# Patient Record
Sex: Female | Born: 1955 | Race: White | Hispanic: No | Marital: Married | Smoking: Former smoker
Health system: Southern US, Community
[De-identification: ages and names within clinical notes are randomized; demographics above are authoritative.]

## PROBLEM LIST (undated history)

## (undated) DIAGNOSIS — M1990 Unspecified osteoarthritis, unspecified site: Secondary | ICD-10-CM

## (undated) HISTORY — PX: FOOT SURGERY: SHX648

---

## 2004-01-08 ENCOUNTER — Ambulatory Visit: Payer: Self-pay | Admitting: Rheumatology

## 2005-04-23 ENCOUNTER — Ambulatory Visit: Payer: Self-pay | Admitting: Rheumatology

## 2005-08-18 ENCOUNTER — Ambulatory Visit: Payer: Self-pay | Admitting: Family Medicine

## 2005-09-14 ENCOUNTER — Ambulatory Visit (HOSPITAL_BASED_OUTPATIENT_CLINIC_OR_DEPARTMENT_OTHER): Admission: RE | Admit: 2005-09-14 | Discharge: 2005-09-15 | Payer: Self-pay | Admitting: Orthopedic Surgery

## 2005-11-12 ENCOUNTER — Encounter: Admission: RE | Admit: 2005-11-12 | Discharge: 2005-11-12 | Payer: Self-pay | Admitting: Orthopedic Surgery

## 2006-02-07 ENCOUNTER — Ambulatory Visit: Payer: Self-pay | Admitting: Obstetrics and Gynecology

## 2007-03-08 ENCOUNTER — Ambulatory Visit: Payer: Self-pay | Admitting: Obstetrics and Gynecology

## 2008-03-11 ENCOUNTER — Ambulatory Visit: Payer: Self-pay | Admitting: Obstetrics and Gynecology

## 2010-07-05 ENCOUNTER — Ambulatory Visit: Payer: Self-pay | Admitting: Unknown Physician Specialty

## 2011-11-15 ENCOUNTER — Ambulatory Visit: Payer: Self-pay | Admitting: Obstetrics and Gynecology

## 2014-01-14 ENCOUNTER — Ambulatory Visit: Payer: Self-pay | Admitting: Family Medicine

## 2014-05-21 ENCOUNTER — Ambulatory Visit: Payer: Self-pay | Admitting: Unknown Physician Specialty

## 2014-05-25 LAB — EXPECTORATED SPUTUM ASSESSMENT W GRAM STAIN, RFLX TO RESP C

## 2016-12-01 ENCOUNTER — Other Ambulatory Visit: Payer: Self-pay | Admitting: Family Medicine

## 2016-12-01 DIAGNOSIS — Z1231 Encounter for screening mammogram for malignant neoplasm of breast: Secondary | ICD-10-CM

## 2016-12-22 ENCOUNTER — Ambulatory Visit
Admission: RE | Admit: 2016-12-22 | Discharge: 2016-12-22 | Disposition: A | Discharge status: Home / Self Care | Payer: BC Managed Care – PPO | Source: Ambulatory Visit | Attending: Family Medicine | Admitting: Family Medicine

## 2016-12-22 DIAGNOSIS — Z1231 Encounter for screening mammogram for malignant neoplasm of breast: Secondary | ICD-10-CM | POA: Diagnosis present

## 2018-01-08 ENCOUNTER — Other Ambulatory Visit: Payer: Self-pay | Admitting: Family Medicine

## 2018-01-08 DIAGNOSIS — Z1231 Encounter for screening mammogram for malignant neoplasm of breast: Secondary | ICD-10-CM

## 2018-02-06 ENCOUNTER — Ambulatory Visit
Admission: RE | Admit: 2018-02-06 | Discharge: 2018-02-06 | Disposition: A | Discharge status: Home / Self Care | Payer: BC Managed Care – PPO | Source: Ambulatory Visit | Attending: Family Medicine | Admitting: Family Medicine

## 2018-02-06 DIAGNOSIS — Z1231 Encounter for screening mammogram for malignant neoplasm of breast: Secondary | ICD-10-CM | POA: Insufficient documentation

## 2019-06-19 ENCOUNTER — Other Ambulatory Visit: Payer: Self-pay | Admitting: Student

## 2019-06-19 DIAGNOSIS — R1032 Left lower quadrant pain: Secondary | ICD-10-CM

## 2019-06-21 ENCOUNTER — Other Ambulatory Visit: Payer: Self-pay

## 2019-06-21 ENCOUNTER — Encounter (INDEPENDENT_AMBULATORY_CARE_PROVIDER_SITE_OTHER): Payer: Self-pay

## 2019-06-21 ENCOUNTER — Ambulatory Visit
Admission: RE | Admit: 2019-06-21 | Discharge: 2019-06-21 | Disposition: A | Discharge status: Home / Self Care | Payer: BC Managed Care – PPO | Source: Ambulatory Visit | Attending: Student | Admitting: Student

## 2019-06-21 DIAGNOSIS — R1032 Left lower quadrant pain: Secondary | ICD-10-CM | POA: Diagnosis present

## 2019-06-27 ENCOUNTER — Ambulatory Visit
Admission: RE | Admit: 2019-06-27 | Discharge: 2019-06-27 | Disposition: A | Discharge status: Home / Self Care | Payer: BC Managed Care – PPO | Attending: Urology | Admitting: Urology

## 2019-06-27 ENCOUNTER — Other Ambulatory Visit: Payer: Self-pay

## 2019-06-27 ENCOUNTER — Encounter: Payer: Self-pay | Admitting: Urology

## 2019-06-27 ENCOUNTER — Encounter
Admission: RE | Disposition: A | Discharge status: Home / Self Care | Payer: Self-pay | Source: Home / Self Care | Attending: Urology

## 2019-06-27 DIAGNOSIS — Z79899 Other long term (current) drug therapy: Secondary | ICD-10-CM | POA: Diagnosis not present

## 2019-06-27 DIAGNOSIS — N201 Calculus of ureter: Secondary | ICD-10-CM | POA: Diagnosis present

## 2019-06-27 DIAGNOSIS — M069 Rheumatoid arthritis, unspecified: Secondary | ICD-10-CM | POA: Diagnosis not present

## 2019-06-27 DIAGNOSIS — Z87442 Personal history of urinary calculi: Secondary | ICD-10-CM | POA: Insufficient documentation

## 2019-06-27 HISTORY — PX: EXTRACORPOREAL SHOCK WAVE LITHOTRIPSY: SHX1557

## 2019-06-27 HISTORY — DX: Unspecified osteoarthritis, unspecified site: M19.90

## 2019-06-27 SURGERY — LITHOTRIPSY, ESWL
Anesthesia: Moderate Sedation | Laterality: Left

## 2019-06-27 MED ORDER — LEVOFLOXACIN 500 MG PO TABS: 500.0000 mg | ORAL_TABLET | ORAL | Status: AC

## 2019-06-27 MED ORDER — MORPHINE SULFATE (PF) 10 MG/ML IV SOLN: 10.0000 mg | Freq: Once | INTRAVENOUS | Status: AC

## 2019-06-27 MED ORDER — DIPHENHYDRAMINE HCL 25 MG PO CAPS
ORAL_CAPSULE | ORAL | Status: AC
  Administered 2019-06-27: 25 mg via ORAL
  Filled 2019-06-27: qty 1

## 2019-06-27 MED ORDER — MIDAZOLAM HCL 2 MG/2ML IJ SOLN
INTRAMUSCULAR | Status: AC
  Administered 2019-06-27: 1 mg via INTRAMUSCULAR
  Filled 2019-06-27: qty 2

## 2019-06-27 MED ORDER — MIDAZOLAM HCL 2 MG/2ML IJ SOLN: 1.0000 mg | Freq: Once | INTRAMUSCULAR | Status: AC

## 2019-06-27 MED ORDER — DIPHENHYDRAMINE HCL 25 MG PO CAPS: 25.0000 mg | ORAL_CAPSULE | ORAL | Status: AC

## 2019-06-27 MED ORDER — FUROSEMIDE 10 MG/ML IJ SOLN: 10.0000 mg | Freq: Once | INTRAMUSCULAR | Status: DC

## 2019-06-27 MED ORDER — LEVOFLOXACIN 500 MG PO TABS
ORAL_TABLET | ORAL | Status: AC
  Administered 2019-06-27: 13:00:00 500 mg via ORAL
  Filled 2019-06-27: qty 1

## 2019-06-27 MED ORDER — MORPHINE SULFATE (PF) 10 MG/ML IV SOLN
INTRAVENOUS | Status: AC
  Administered 2019-06-27: 10 mg via INTRAMUSCULAR
  Filled 2019-06-27: qty 1

## 2019-06-27 MED ORDER — DEXTROSE-NACL 5-0.45 % IV SOLN: INTRAVENOUS | Status: DC

## 2019-06-27 MED ORDER — PROMETHAZINE HCL 25 MG/ML IJ SOLN: 25.0000 mg | Freq: Once | INTRAMUSCULAR | Status: AC

## 2019-06-27 MED ORDER — PROMETHAZINE HCL 25 MG/ML IJ SOLN
INTRAMUSCULAR | Status: AC
  Administered 2019-06-27: 25 mg via INTRAMUSCULAR
  Filled 2019-06-27: qty 1

## 2019-06-27 NOTE — Discharge Instructions (Addendum)
AMBULATORY SURGERY  DISCHARGE INSTRUCTIONS   1) The drugs that you were given will stay in your system until tomorrow so for the next 24 hours you should not:  A) Drive an automobile B) Make any legal decisions C) Drink any alcoholic beverage   2) You may resume regular meals tomorrow.  Today it is better to start with liquids and gradually work up to solid foods.  You may eat anything you prefer, but it is better to start with liquids, then soup and crackers, and gradually work up to solid foods.   3) Please notify your doctor immediately if you have any unusual bleeding, trouble breathing, redness and pain at the surgery site, drainage, fever, or pain not relieved by medication.    4) Additional Instructions: FOllow up 5/12/210 AT 0930. Follow Dr Sheral Flow dc instruction sheet        Please contact your physician with any problems or Same Day Surgery at [phone], Monday through Friday 6 am to 4 pm, or [place] [facility] number at [phone].Lithotripsy, Care After This sheet gives you information about how to care for yourself after your procedure. Your health care provider may also give you more specific instructions. If you have problems or questions, contact your health care provider. What can I expect after the procedure? After the procedure, it is common to have:  Some blood in your urine. This should only last for a few days.  Soreness in your back, sides, or upper abdomen for a few days.  Blotches or bruises on your back where the pressure wave entered the skin.  Pain, discomfort, or nausea when pieces (fragments) of the kidney stone move through the tube that carries urine from the kidney to the bladder (ureter). Stone fragments may pass soon after the procedure, but they may continue to pass for up to 4-8 weeks. ? If you have severe pain or nausea, contact your health care provider. This may be caused by a large stone that was not broken up, and this may  mean that you need more treatment.  Some pain or discomfort during urination.  Some pain or discomfort in the lower abdomen or (in men) at the base of the penis. Follow these instructions at home: Medicines  Take over-the-counter and prescription medicines only as told by your health care provider.  If you were prescribed an antibiotic medicine, take it as told by your health care provider. Do not stop taking the antibiotic even if you start to feel better.  Do not drive for 24 hours if you were given a medicine to help you relax (sedative).  Do not drive or use heavy machinery while taking prescription pain medicine. Eating and drinking      Drink enough water and fluids to keep your urine clear or pale yellow. This helps any remaining pieces of the stone to pass. It can also help prevent new stones from forming.  Eat plenty of fresh fruits and vegetables.  Follow instructions from your health care provider about eating and drinking restrictions. You may be instructed: ? To reduce how much salt (sodium) you eat or drink. Check ingredients and nutrition facts on packaged foods and beverages. ? To reduce how much meat you eat.  Eat the recommended amount of calcium for your age and gender. Ask your health care provider how much calcium you should have. General instructions  Get plenty of rest.  Most people can resume normal activities 1-2 days after the procedure. Ask your  health care provider what activities are safe for you.  Your health care provider may direct you to lie in a certain position (postural drainage) and tap firmly (percuss) over your kidney area to help stone fragments pass. Follow instructions as told by your health care provider.  If directed, strain all urine through the strainer that was provided by your health care provider. ? Keep all fragments for your health care provider to see. Any stones that are found may be sent to a medical lab for examination. The  stone may be as small as a grain of salt.  Keep all follow-up visits as told by your health care provider. This is important. Contact a health care provider if:  You have pain that is severe or does not get better with medicine.  You have nausea that is severe or does not go away.  You have blood in your urine longer than your health care provider told you to expect.  You have more blood in your urine.  You have pain during urination that does not go away.  You urinate more frequently than usual and this does not go away.  You develop a rash or any other possible signs of an allergic reaction. Get help right away if:  You have severe pain in your back, sides, or upper abdomen.  You have severe pain while urinating.  Your urine is very dark red.  You have blood in your stool (feces).  You cannot pass any urine at all.  You feel a strong urge to urinate after emptying your bladder.  You have a fever or chills.  You develop shortness of breath, difficulty breathing, or chest pain.  You have severe nausea that leads to persistent vomiting.  You faint. Summary  After this procedure, it is common to have some pain, discomfort, or nausea when pieces (fragments) of the kidney stone move through the tube that carries urine from the kidney to the bladder (ureter). If this pain or nausea is severe, however, you should contact your health care provider.  Most people can resume normal activities 1-2 days after the procedure. Ask your health care provider what activities are safe for you.  Drink enough water and fluids to keep your urine clear or pale yellow. This helps any remaining pieces of the stone to pass, and it can help prevent new stones from forming.  If directed, strain your urine and keep all fragments for your health care provider to see. Fragments or stones may be as small as a grain of salt.  Get help right away if you have severe pain in your back, sides, or upper  abdomen or have severe pain while urinating. This information is not intended to replace advice given to you by your health care provider. Make sure you discuss any questions you have with your health care provider. Document Revised: 05/28/2018 Document Reviewed: 01/06/2016 Elsevier Patient Education  2020 [employer].

## 2020-05-18 ENCOUNTER — Other Ambulatory Visit: Payer: Self-pay | Admitting: Family Medicine

## 2020-05-18 DIAGNOSIS — Z1231 Encounter for screening mammogram for malignant neoplasm of breast: Secondary | ICD-10-CM

## 2020-06-08 ENCOUNTER — Other Ambulatory Visit: Payer: Self-pay

## 2020-06-08 ENCOUNTER — Ambulatory Visit
Admission: RE | Admit: 2020-06-08 | Discharge: 2020-06-08 | Disposition: A | Discharge status: Home / Self Care | Payer: BC Managed Care – PPO | Source: Ambulatory Visit | Attending: Family Medicine | Admitting: Family Medicine

## 2020-06-08 DIAGNOSIS — Z1231 Encounter for screening mammogram for malignant neoplasm of breast: Secondary | ICD-10-CM | POA: Diagnosis present

## 2021-06-01 ENCOUNTER — Other Ambulatory Visit: Payer: Self-pay | Admitting: Physical Medicine & Rehabilitation

## 2021-06-01 DIAGNOSIS — G8929 Other chronic pain: Secondary | ICD-10-CM

## 2021-06-09 ENCOUNTER — Ambulatory Visit
Admission: RE | Admit: 2021-06-09 | Discharge: 2021-06-09 | Disposition: A | Discharge status: Home / Self Care | Payer: BC Managed Care – PPO | Source: Ambulatory Visit | Attending: Physical Medicine & Rehabilitation | Admitting: Physical Medicine & Rehabilitation

## 2021-06-09 DIAGNOSIS — G8929 Other chronic pain: Secondary | ICD-10-CM | POA: Diagnosis present

## 2021-06-09 DIAGNOSIS — M5441 Lumbago with sciatica, right side: Secondary | ICD-10-CM | POA: Diagnosis present

## 2022-04-26 IMAGING — MR MR LUMBAR SPINE W/O CM
4 of 5 series · 29 of 48 positions shown · non-contrast
Comparison: Lumbar MRI 04/23/2005. CT Abdomen and Pelvis
06/21/2019.

CLINICAL DATA: 65-year-old female with persistent low back pain. No
known injury.

EXAM:
MRI LUMBAR SPINE WITHOUT CONTRAST
TECHNIQUE: Multiplanar, multisequence MR imaging of the lumbar spine was
performed. No intravenous contrast was administered.

[Series 5: T2 · sagittal · 4.0mm · 0.81mm/px · 6 of 17 slices shown (1 of 2)]
[im 1/17]
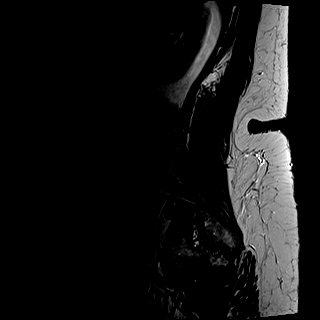
[im 4/17]
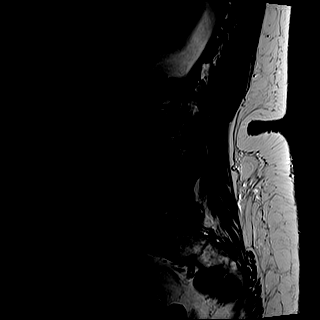
[im 7/17]
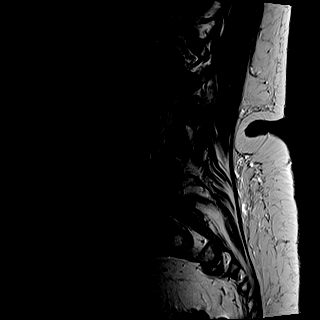
[im 10/17]
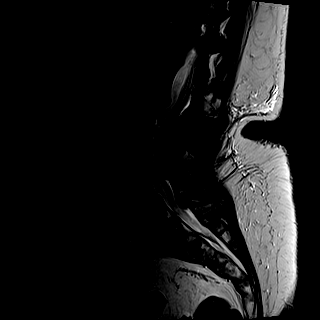
[im 13/17]
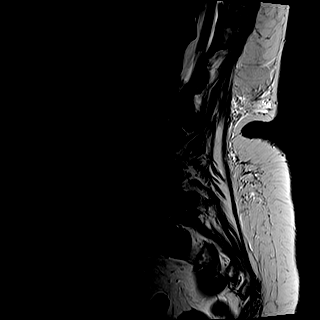
[im 17/17]
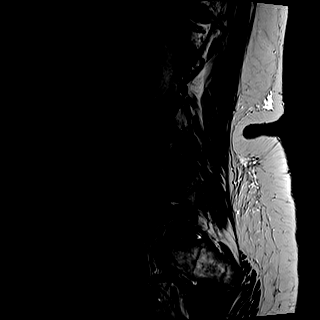

[Series 6: T1 · sagittal · 4.0mm · 0.81mm/px · 7 of 17 slices shown (1 of 2)]
[im 1/17]
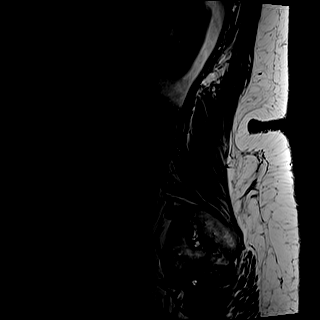
[im 3/17]
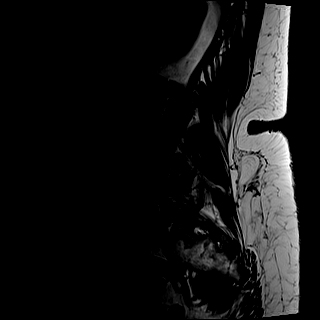
[im 6/17]
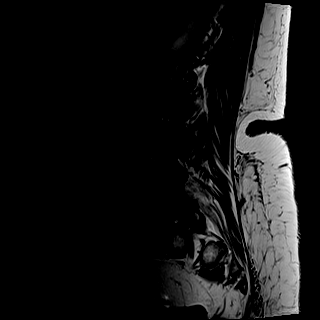
[im 9/17]
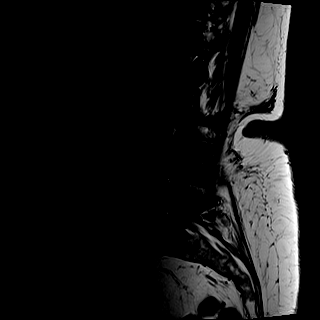
[im 11/17]
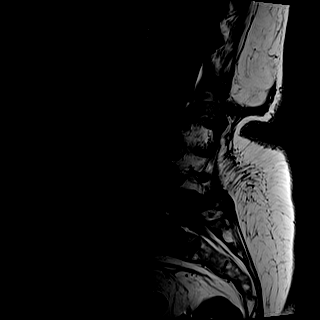
[im 14/17]
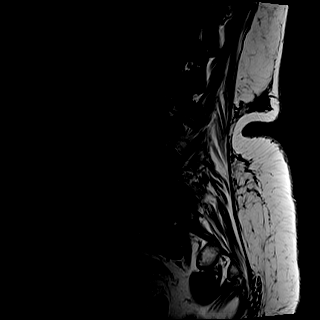
[im 17/17]
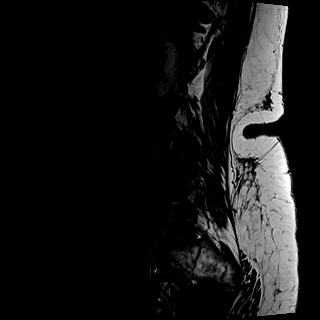

[Series 8: T2 · axial · 4.0mm · 0.78mm/px · z∈[-55,+182]mm · 8 of 36 slices shown (2 of 2)]
[im 1/36]
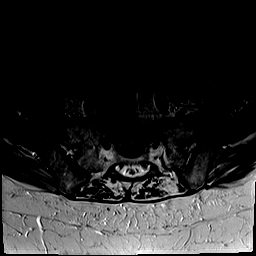
[im 6/36]
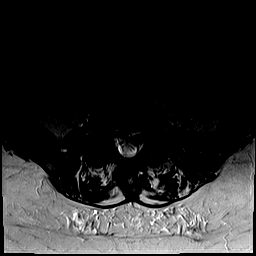
[im 11/36]
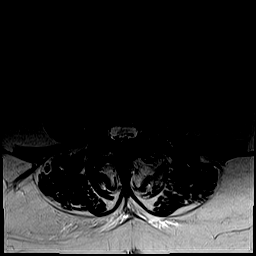
[im 17/36]
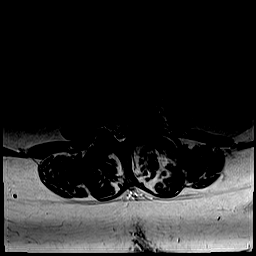
[im 19/36]
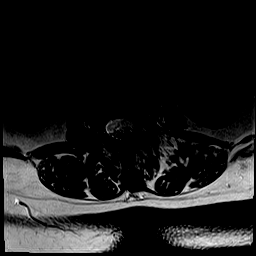
[im 25/36]
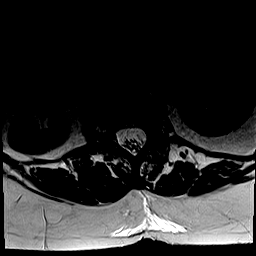
[im 30/36]
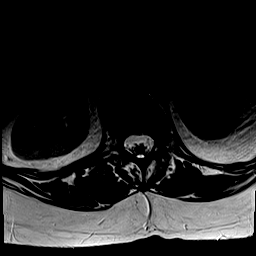
[im 36/36]
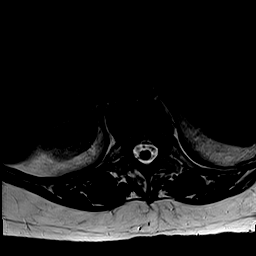

[Series 9: T1 · axial · 4.0mm · 0.39mm/px · z∈[-55,+182]mm · 8 of 36 slices shown (2 of 2)]
[im 1/36]
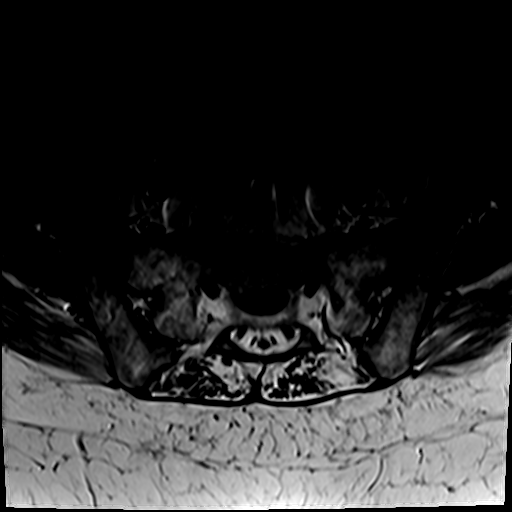
[im 6/36]
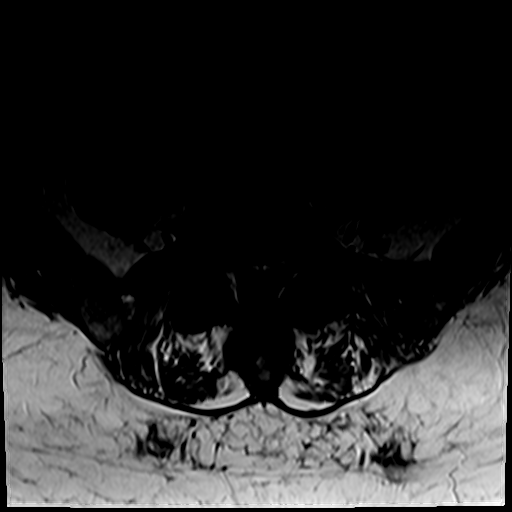
[im 11/36]
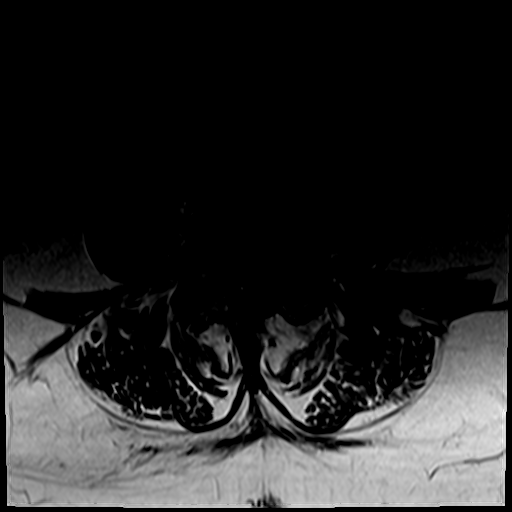
[im 17/36]
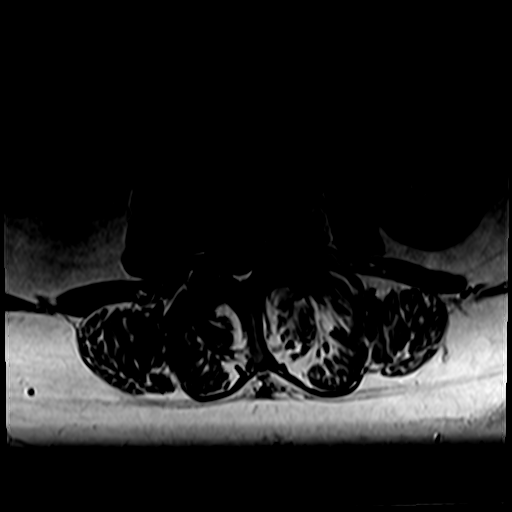
[im 19/36]
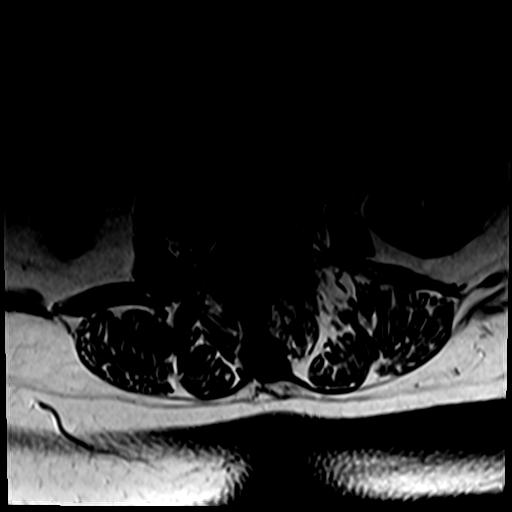
[im 25/36]
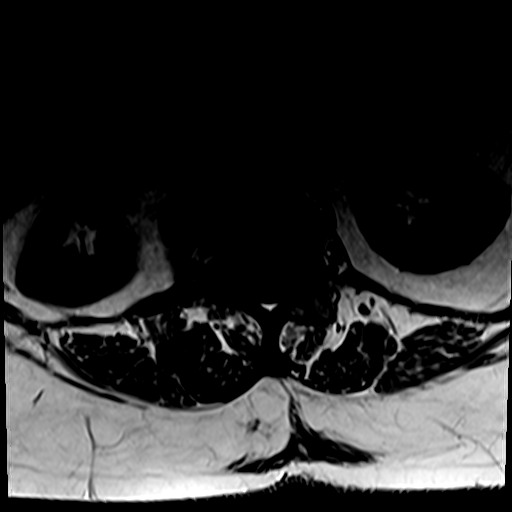
[im 30/36]
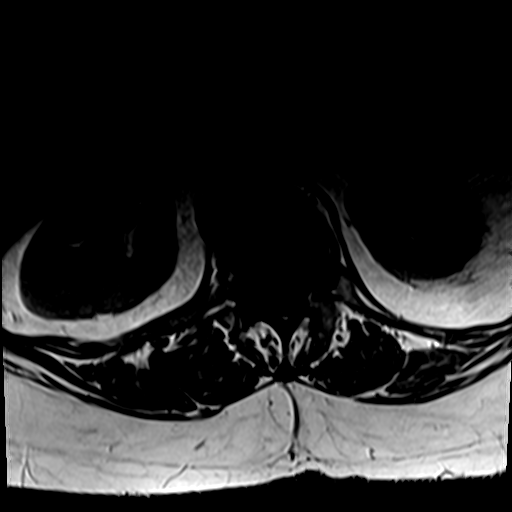
[im 36/36]
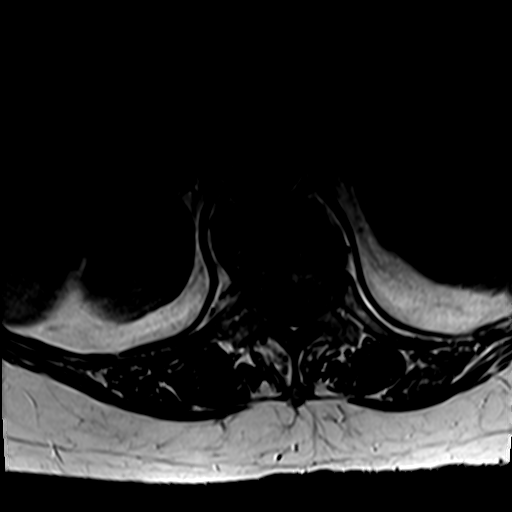

[29 of 48 positions shown; findings below may reference images not displayed]

FINDINGS: Segmentation:  Normal on the comparison CT.

Alignment: Progressed lumbar lordosis and multilevel
spondylolisthesis since 9999. Grade 1 anterolisthesis of L5 on S1
has increased and is 6 mm. New anterolisthesis at L4-L5 is 7-8 mm.
Mild new anterolisthesis at L3-L4. Subtle retrolisthesis at L1-L2.
Mild-to-moderate underlying dextroconvex lumbar scoliosis appears
increased since 9999 also.

Vertebrae: Chronic degenerative endplate marrow signal changes
throughout the lumbar spine. Superimposed mild acute degenerative
endplate marrow edema far laterally on the right at L1-L2. Intact
visible sacrum. Normal background bone marrow signal.

Conus medullaris and cauda equina: Conus extends to the T12-L1
level. No lower spinal cord or conus signal abnormality.

Paraspinal and other soft tissues: Resolved left hydronephrosis
since the prior CT Abdomen and Pelvis. Negative visualized abdominal
viscera and paraspinal soft tissues.

Disc levels:

T10-T11 and T11-T12: Partially visible disc space loss, disc
osteophyte complex and posterior element hypertrophy. No definite
lower thoracic spinal stenosis.

T12-L1: Small right paracentral and caudal disc extrusion (series 8,
image 10). No stenosis.

L1-L2: Moderate disc space loss and circumferential disc bulge or
protrusion with broad-based posterior component. Endplate and facet
spurring greater on the left. Borderline to mild spinal stenosis.
Mild bilateral lateral recess stenosis (L2 nerve levels). Moderate
to severe L1 neural foraminal stenosis greater on the right, with
bulky right foraminal disc on series 8, image 14. This corresponds
to the area of endplate marrow edema.

L2-L3: Disc space loss with circumferential disc osteophyte complex
eccentric to the left and far laterally. Mild to moderate facet and
ligament flavum hypertrophy. Mild left lateral recess stenosis.
Moderate to severe left L2 neural foraminal stenosis.

L3-L4: Mild anterolisthesis with circumferential disc/pseudo disc.
Broad-based posterior component with mild to moderate facet and
ligament flavum hypertrophy. No spinal stenosis. Mild to moderate
left lateral recess stenosis (left L4 nerve level). Moderate to
severe left and moderate right L3 foraminal stenosis.

L4-L5: Maximal anterolisthesis at this level. Circumferential
disc/pseudo disc and severe bilateral facet hypertrophy with mild to
moderate ligament flavum hypertrophy. Degenerative facet joint fluid
bilaterally. Mild to moderate spinal stenosis. Only mild lateral
recess stenosis. Moderate left and moderate to severe right L4
foraminal stenosis.

L5-S1: Anterolisthesis with moderate to severe disc space loss and
bulky circumferential disc/pseudo disc which is lobulated to the
left posteriorly. Mild facet and ligament flavum hypertrophy. No
spinal stenosis. Mild to moderate left lateral recess stenosis (left
S1 nerve level). Moderate to severe bilateral L5 foraminal stenosis
greater on the right.
IMPRESSION: 1. Progressed multilevel lumbar grade 1 spondylolisthesis since
9999. Associated advanced disc, endplate, and posterior element
degeneration.

2. Mild acute degenerative endplate marrow edema far laterally on
the right at L1-L2 associated with disc herniation there, moderate
to severe right neural foraminal stenosis. Query right L1
radiculitis. Mild spinal stenosis at that level.

3. Anterolisthesis maximal at L4-L5 with mild to moderate
multifactorial spinal stenosis. Up to moderate left lateral recess
stenosis at L3-L4 and L5-S1.

4. Moderate or severe neural foraminal stenosis at the left L2, left
L3, bilateral L4 and L5 nerve levels.
